# Patient Record
Sex: Female | Born: 1965 | Race: White | Hispanic: No | Marital: Married | Smoking: Never smoker
Health system: Southern US, Community
[De-identification: ages and names within clinical notes are randomized; demographics above are authoritative.]

## PROBLEM LIST (undated history)

## (undated) DIAGNOSIS — W19XXXA Unspecified fall, initial encounter: Secondary | ICD-10-CM

## (undated) DIAGNOSIS — G43909 Migraine, unspecified, not intractable, without status migrainosus: Secondary | ICD-10-CM

## (undated) DIAGNOSIS — M549 Dorsalgia, unspecified: Secondary | ICD-10-CM

## (undated) DIAGNOSIS — M797 Fibromyalgia: Secondary | ICD-10-CM

## (undated) HISTORY — PX: OTHER SURGICAL HISTORY: SHX169

## (undated) HISTORY — PX: APPENDECTOMY: SHX54

## (undated) HISTORY — DX: Migraine, unspecified, not intractable, without status migrainosus: G43.909

## (undated) HISTORY — DX: Dorsalgia, unspecified: M54.9

## (undated) HISTORY — PX: TONSILLECTOMY: SUR1361

## (undated) HISTORY — DX: Unspecified fall, initial encounter: W19.XXXA

## (undated) HISTORY — DX: Fibromyalgia: M79.7

## (undated) HISTORY — PX: VAGINAL HYSTERECTOMY: SUR661

---

## 2015-09-08 ENCOUNTER — Encounter: Payer: Self-pay | Admitting: Neurology

## 2015-09-08 ENCOUNTER — Ambulatory Visit (INDEPENDENT_AMBULATORY_CARE_PROVIDER_SITE_OTHER): Payer: BLUE CROSS/BLUE SHIELD | Admitting: Neurology

## 2015-09-08 VITALS — BP 133/82 | HR 79 | Ht 63.0 in | Wt 163.4 lb

## 2015-09-08 DIAGNOSIS — M79604 Pain in right leg: Secondary | ICD-10-CM | POA: Diagnosis not present

## 2015-09-08 DIAGNOSIS — M797 Fibromyalgia: Secondary | ICD-10-CM | POA: Diagnosis not present

## 2015-09-08 DIAGNOSIS — G43709 Chronic migraine without aura, not intractable, without status migrainosus: Secondary | ICD-10-CM

## 2015-09-08 NOTE — Patient Instructions (Addendum)
Remember to drink plenty of fluid, eat healthy meals and do not skip any meals. Try to eat protein with a every meal and eat a healthy snack such as fruit or nuts in between meals. Try to keep a regular sleep-wake schedule and try to exercise daily, particularly in the form of walking, 20-30 minutes a day, if you can.   As far as diagnostic testing: emg/ncs, botox  Our phone number is [phone]. We also have an after hours call service for urgent matters and there is a physician on-call for urgent questions. For any emergencies you know to call 911 or go to the nearest emergency room
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
## 2015-09-10 ENCOUNTER — Encounter: Payer: Self-pay | Admitting: Neurology

## 2015-09-10 DIAGNOSIS — M79604 Pain in right leg: Secondary | ICD-10-CM | POA: Insufficient documentation

## 2015-09-10 DIAGNOSIS — M797 Fibromyalgia: Secondary | ICD-10-CM | POA: Insufficient documentation

## 2015-09-10 DIAGNOSIS — G43709 Chronic migraine without aura, not intractable, without status migrainosus: Secondary | ICD-10-CM | POA: Insufficient documentation

## 2015-10-01 ENCOUNTER — Telehealth: Payer: Self-pay | Admitting: Neurology

## 2015-10-01 NOTE — Telephone Encounter (Signed)
Called the patient to schedule botox injection. Left a VM asking her to return my call.

## 2015-10-12 ENCOUNTER — Ambulatory Visit (INDEPENDENT_AMBULATORY_CARE_PROVIDER_SITE_OTHER): Payer: BLUE CROSS/BLUE SHIELD | Admitting: Neurology

## 2015-10-12 ENCOUNTER — Ambulatory Visit (INDEPENDENT_AMBULATORY_CARE_PROVIDER_SITE_OTHER): Payer: Self-pay | Admitting: Neurology

## 2015-10-12 DIAGNOSIS — M5410 Radiculopathy, site unspecified: Secondary | ICD-10-CM

## 2015-10-12 DIAGNOSIS — M79604 Pain in right leg: Secondary | ICD-10-CM

## 2015-10-12 DIAGNOSIS — Z0289 Encounter for other administrative examinations: Secondary | ICD-10-CM

## 2015-10-12 NOTE — Progress Notes (Signed)
  [facility]    Provider:  Dr Lucia GaskinsAhern Referring Provider: Jacquiline DoeAlabanza, Thomas, MD Primary Care Physician:  Jacquiline DoeALABANZA,THOMAS, MD   HPI:  Cristina CongressKimberly Gloeckner is a 50 y.o. female here as a referral from Dr. Charlynne CousinsAlabanza for chronic right leg pain. She has been evaluated by multiple physicians. Pain radiates from the back into her right thigh and down the lateral aspect of her right leg. This has been ongoing for years, [facility]/2014 stating that: "She now has numbness in the right thigh constantly for the past year, She has chronic back pain and multilevel DDD with no significant stenosis at any level on L spine MRI 07/19/10". I have requested a current copy of her last MRI of the lumbar spine.   Summary  Nerve conduction studies were performed on the bilateral lower extremities:  The right Peroneal motor nerve showed decreased amplitude (1.563mV,N>2) with normal F Wave latency The left Peroneal motor nerve showed decreased amplitude (1.210mV,N>2) with normal F Wave latency The bilateral Tibial motor nerves showed normal conductions with normal F Wave latencies The bilateral Sural sensory nerve conductions were within normal limits The bilateral Superficial Peroneal sensory nerve conductions were within normal limits The bilateral Saphenous sensory nerve conductions were within normal limits Bilateral H Reflexes showed normal latencies  EMG Needle study was performed on selected right lower extremity muscles:   The Gluteus Medius and Gluteus Maximus, Biceps Femoris (long head),  Iliopsoas, Vastus Medialis, Anterior Tibialis, Medial Gastrocnemius muscles and right-sided lower paraspinal muscles were within normal limits.  Conclusion: Decreased amplitude of the bilateral Peroneal motor nerves with normal sensory conductions suggestive of L5/S1 radiculopathy however emg needle examination was within normal limits.   Naomie DeanAntonia Larone Kliethermes, MD  [facility] [address] [place], [place] [address]  Phone [phone] Fax [phone]

## 2015-10-13 NOTE — Progress Notes (Signed)
See procedure note.

## 2015-11-04 ENCOUNTER — Telehealth: Payer: Self-pay | Admitting: Neurology

## 2015-11-04 NOTE — Telephone Encounter (Signed)
Pt called said Dr Lucia GaskinsAhern had talked about referring her to ortho for her back but she hasn't heard back from clinic. She also said NCV/EMG visit has incorrect information in the dictation. Please call
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
## 2015-11-05 ENCOUNTER — Other Ambulatory Visit: Payer: Self-pay | Admitting: Neurology

## 2015-11-05 DIAGNOSIS — M5416 Radiculopathy, lumbar region: Secondary | ICD-10-CM

## 2015-11-05 NOTE — Telephone Encounter (Signed)
Let her know I placed the referral again and we will get on that. Also let her know that I will leave it to Orthopaedic surgery to order the CT myelogram.  As far as the report goes, as mentioned I reviewed her chart and this was in her chart. I can't change it, it is not erroneous, I was just summarizing her old charts and documents. thanks

## 2015-11-05 NOTE — Telephone Encounter (Signed)
Dr Lucia GaskinsAhern- See note below  called and spoke to pt. Relayed Dr Lucia GaskinsAhern message below. She verbalized understanding. She stated that Dr Lucia GaskinsAhern forgot to put in report that she had numbness on the right side of her head and that Dr Lucia GaskinsAhern told her at this point, this will probably not go away. I advised I will give Dr Lucia GaskinsAhern the message. She is wanting Dr Lucia GaskinsAhern to fix the report to include this. I advised pt to call me in a week or two if she has not yet heard about scheduled appt with orthopaedic specialist.

## 2016-03-17 ENCOUNTER — Other Ambulatory Visit: Payer: Self-pay | Admitting: Orthopedic Surgery

## 2016-03-17 DIAGNOSIS — M5441 Lumbago with sciatica, right side: Secondary | ICD-10-CM

## 2016-04-29 ENCOUNTER — Other Ambulatory Visit: Payer: Self-pay

## 2016-05-20 ENCOUNTER — Ambulatory Visit
Admission: RE | Admit: 2016-05-20 | Discharge: 2016-05-20 | Disposition: A | Discharge status: Home / Self Care | Payer: BLUE CROSS/BLUE SHIELD | Source: Ambulatory Visit | Attending: Orthopedic Surgery | Admitting: Orthopedic Surgery

## 2016-05-20 DIAGNOSIS — M5441 Lumbago with sciatica, right side: Secondary | ICD-10-CM

## 2016-05-20 MED ORDER — IOPAMIDOL (ISOVUE-M 200) INJECTION 41%
15.0000 mL | Freq: Once | INTRAMUSCULAR | Status: AC
  Administered 2016-05-20: 15 mL via INTRATHECAL

## 2016-05-20 MED ORDER — DIAZEPAM 5 MG PO TABS
10.0000 mg | ORAL_TABLET | Freq: Once | ORAL | Status: AC
  Administered 2016-05-20: 10 mg via ORAL

## 2016-05-20 NOTE — Discharge Instructions (Signed)
Myelogram Discharge Instructions  1. Go home and rest quietly for the next 24 hours.  It is important to lie flat for the next 24 hours.  Get up only to go to the restroom.  You may lie in the bed or on a couch on your back, your stomach, your left side or your right side.  You may have one pillow under your head.  You may have pillows between your knees while you are on your side or under your knees while you are on your back.  2. DO NOT drive today.  Recline the seat as far back as it will go, while still wearing your seat belt, on the way home.  3. You may get up to go to the bathroom as needed.  You may sit up for 10 minutes to eat.  You may resume your normal diet and medications unless otherwise indicated.  Drink lots of extra fluids today and tomorrow.  4. The incidence of headache, nausea, or vomiting is about 5% (one in 20 patients).  If you develop a headache, lie flat and drink plenty of fluids until the headache goes away.  Caffeinated beverages may be helpful.  If you develop severe nausea and vomiting or a headache that does not go away with flat bed rest, call [phone].  5. You may resume normal activities after your 24 hours of bed rest is over; however, do not exert yourself strongly or do any heavy lifting tomorrow. If when you get up you have a headache when standing, go back to bed and force fluids for another 24 hours.  6. Call your physician for a follow-up appointment.  The results of your myelogram will be sent directly to your physician by the following day.  7. If you have any questions or if complications develop after you arrive home, please call [phone].  Discharge instructions have been explained to the patient.  The patient, or the person responsible for the patient, fully understands these instructions.        May resume Maxalt on Dec. 9, 2017, after 9:30 am.

## 2016-05-20 NOTE — Progress Notes (Signed)
Patient states she has been off Maxalt for at least the past two days.  jkl

## 2016-08-29 ENCOUNTER — Ambulatory Visit: Payer: BLUE CROSS/BLUE SHIELD | Admitting: Neurology

## 2016-08-29 ENCOUNTER — Telehealth: Payer: Self-pay

## 2016-08-29 NOTE — Telephone Encounter (Signed)
Pt no-showed her appt this morning. 

## 2016-08-31 ENCOUNTER — Encounter: Payer: Self-pay | Admitting: Neurology

## 2016-12-26 ENCOUNTER — Encounter: Payer: Self-pay | Admitting: Neurology

## 2016-12-26 ENCOUNTER — Ambulatory Visit (INDEPENDENT_AMBULATORY_CARE_PROVIDER_SITE_OTHER): Payer: BLUE CROSS/BLUE SHIELD | Admitting: Neurology

## 2016-12-26 VITALS — BP 134/84 | HR 73 | Ht 63.0 in | Wt 160.0 lb

## 2016-12-26 DIAGNOSIS — G8929 Other chronic pain: Secondary | ICD-10-CM

## 2016-12-26 DIAGNOSIS — M542 Cervicalgia: Secondary | ICD-10-CM

## 2016-12-26 DIAGNOSIS — M5440 Lumbago with sciatica, unspecified side: Secondary | ICD-10-CM

## 2016-12-26 NOTE — Patient Instructions (Addendum)
Remember to drink plenty of fluid, eat healthy meals and do not skip any meals. Try to eat protein with a every meal and eat a healthy snack such as fruit or nuts in between meals. Try to keep a regular sleep-wake schedule and try to exercise daily, particularly in the form of walking, 20-30 minutes a day, if you can.   As far as your medications are concerned, I would like to suggest: F/u with pain management doctor  I would like to see you back as needed, sooner if we need to. Please call us with any interim questions, concerns, problems, updates or refill requests.    Our phone number is [phone]. We also have an after hours call service for urgent matters and there is a physician on-call for urgent questions. For any emergencies you know to call 911 or go to the nearest emergency room  Dry needling Botox for headaches
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
## 2017-08-24 IMAGING — CT CT L SPINE W/ CM
1 of 6 series · 6 of 14 positions shown, 8 images · non-contrast
Comparison: None

CLINICAL DATA: Low back pain.  No lateralizing radicular symptoms.
TECHNIQUE: Contiguous axial images were obtained through the Lumbar spine after
the intrathecal infusion of infusion. Coronal and sagittal
reconstructions were obtained of the axial image sets.

[Series 3: l spine soft · axial · 0.27mm/px · z∈[+870,+1026]mm · 6 of 74 slices shown, 8 images]
[im 11/74  soft-tissue]
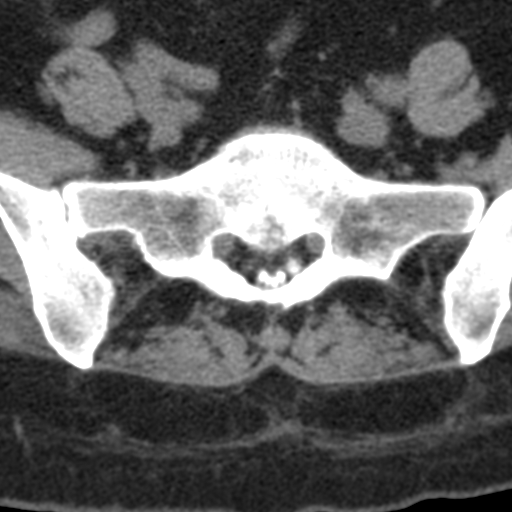
[im 11/74  bone]
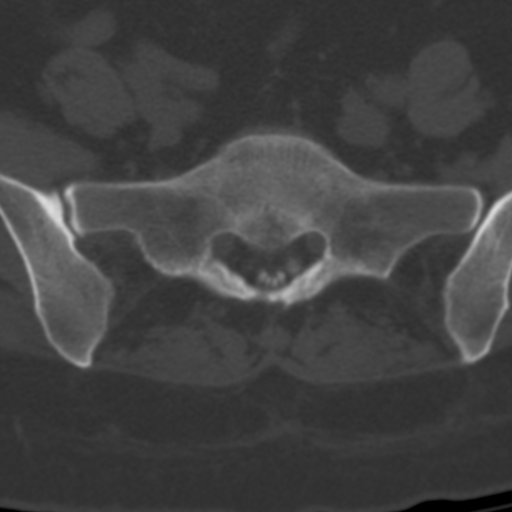
[im 21/74  bone]
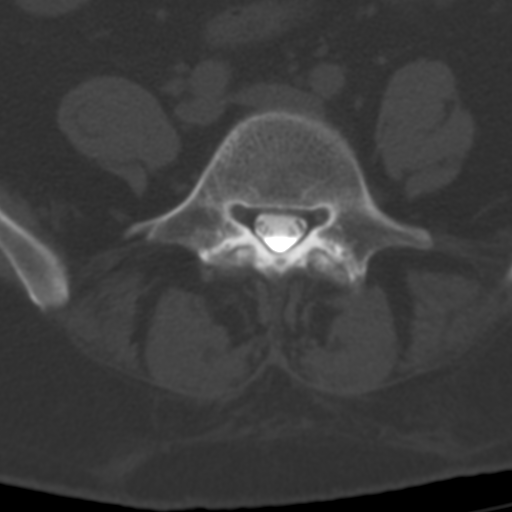
[im 32/74  bone]
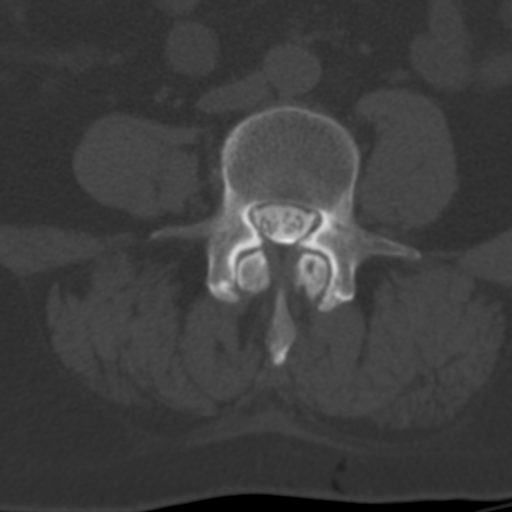
[im 42/74  bone]
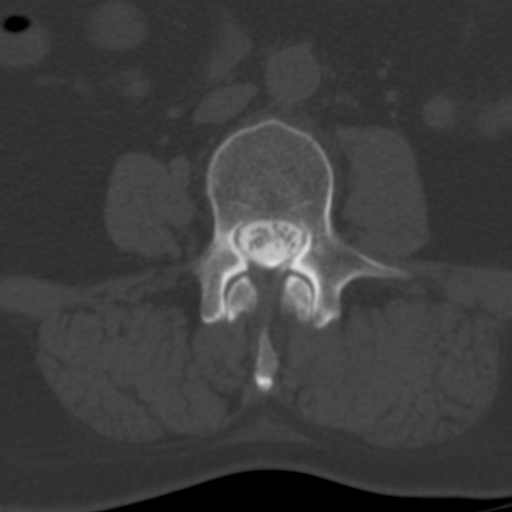
[im 53/74  soft-tissue]
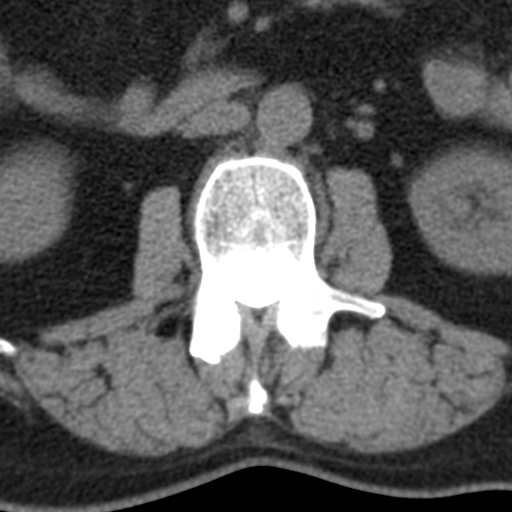
[im 53/74  bone]
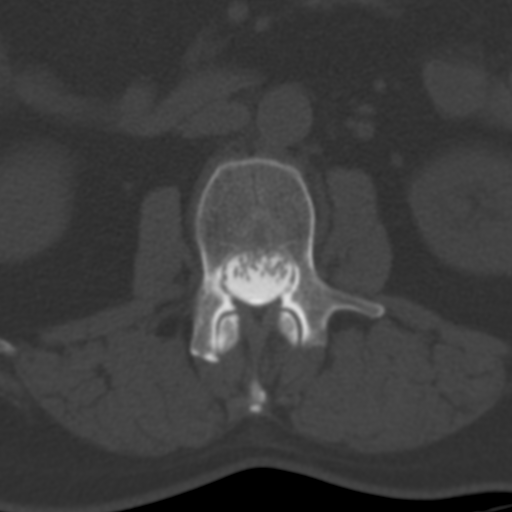
[im 63/74  bone]
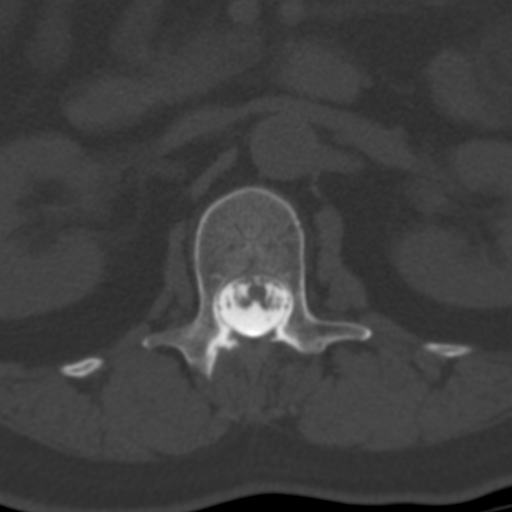

[6 of 14 positions shown; findings below may reference images not displayed]

EXAM:
LUMBAR MYELOGRAM

FLUOROSCOPY TIME:  17 seconds corresponding to a Dose Area Product
of 163.55 ?Gy*m2

PROCEDURE:
After thorough discussion of risks and benefits of the procedure
including bleeding, infection, injury to nerves, blood vessels,
adjacent structures as well as headache and CSF leak, written and
oral informed consent was obtained. Consent was obtained by Dr. Lai Na
[place]. Time out form was completed.

Patient was positioned prone on the fluoroscopy table. Local
anesthesia was provided with 1% lidocaine without epinephrine after
prepped and draped in the usual sterile fashion. Puncture was
performed at L3-L4 using a 3 1/2 inch 22-gauge spinal needle via
midline approach. Using a single pass through the dura, the needle
was placed within the thecal sac, with return of clear CSF. 15 mL of
Isovue-M 200 was injected into the thecal sac, with normal
opacification of the nerve roots and cauda equina consistent with
free flow within the subarachnoid space.

I personally performed the lumbar puncture and administered the
intrathecal contrast. I also personally supervised acquisition of
the myelogram images.
FINDINGS: LUMBAR MYELOGRAM FINDINGS:

The breasts dense subarachnoid space. No nerve root cut off or
spinal stenosis. Trace anterolisthesis with patient prone for
myelography. Slight disc space narrowing at L4-5.

With patient standing, asymmetric loss of interspace height at L3-4
on the RIGHT is observed, contributing to mild dextroconvex
scoliosis. Cholecystectomy clips.

With patient standing in the lateral view, shallow ventral defects
are seen at L3-4, L4-5, and L5-S1, slightly more prominent from
prone imaging. In neutral, anterolisthesis at L4-5 is 1 mm. With
patient in flexion, anterolisthesis is 2 mm. With patient in
extension, there is no anterolisthesis at L4-5.

CT LUMBAR MYELOGRAM FINDINGS:

Segmentation: Normal.

Alignment: With patient recumbent for CT there is 1 mm
anterolisthesis L4-5. There is asymmetric loss of interspace height
at L3-4 which contributes to mild rightward deviation of the upper
lumbar spine

Vertebrae: No worrisome osseous lesion.

Conus medullaris: Normal in size and location. Termination at lower
L1.

Paraspinal tissues: No evidence for hydronephrosis or paravertebral
mass.

Disc levels:

L1-L2:  Normal.

L2-L3:  Normal.

L3-L4: Asymmetric loss of interspace height on the RIGHT. Annular
bulge. BILATERAL facet arthropathy. No significant stenosis. Slight
RIGHT subarticular zone and foraminal zone narrowing not clearly
compressive.

L4-L5: 1 mm of facet mediated anterolisthesis. BILATERAL facet
arthropathy appears fairly advanced, without significant ligamentum
flavum hypertrophy. Annular bulging. No subarticular zone or
foraminal zone narrowing. No significant stenosis.

L5-S1: Mild facet arthropathy. Unremarkable interspace. No
impingement.
IMPRESSION: LUMBAR MYELOGRAM IMPRESSION:

Dynamic instability at L4-5, up to 2 mm in standing flexion. Mild
ventral defect without significant stenosis or nerve root cut off at
this level.

CT LUMBAR MYELOGRAM IMPRESSION:

Slight loss of disc height, annular bulging, facet arthropathy, and
1 mm slip at L4-5 not clearly compressive.

Asymmetric loss of interspace height at L3-4 on the RIGHT, also with
annular bulging, without neural impingement.
